# Patient Record
Sex: Female | Born: 1980 | Race: White | Hispanic: No | Marital: Married | Smoking: Never smoker
Health system: Southern US, Community
[De-identification: ages and names within clinical notes are randomized; demographics above are authoritative.]

## PROBLEM LIST (undated history)

## (undated) DIAGNOSIS — E079 Disorder of thyroid, unspecified: Secondary | ICD-10-CM

## (undated) HISTORY — DX: Disorder of thyroid, unspecified: E07.9

---

## 2007-12-22 ENCOUNTER — Encounter: Admission: RE | Admit: 2007-12-22 | Discharge: 2007-12-22 | Payer: Self-pay | Admitting: Family Medicine

## 2013-10-05 ENCOUNTER — Telehealth: Payer: Self-pay | Admitting: Endocrinology

## 2013-10-07 ENCOUNTER — Telehealth: Payer: Self-pay | Admitting: Endocrinology

## 2013-10-07 NOTE — Telephone Encounter (Signed)
Left message to return call 

## 2013-10-08 ENCOUNTER — Telehealth: Payer: Self-pay | Admitting: *Deleted

## 2013-10-08 MED ORDER — LEVOTHYROXINE SODIUM 50 MCG PO TABS: 50.0000 ug | ORAL_TABLET | Freq: Every day | ORAL | Status: DC

## 2013-10-08 NOTE — Telephone Encounter (Signed)
Gave patient 30 days until she makes appointment.

## 2013-11-09 ENCOUNTER — Other Ambulatory Visit: Payer: Self-pay | Admitting: Endocrinology

## 2013-12-13 ENCOUNTER — Other Ambulatory Visit: Payer: Self-pay | Admitting: Endocrinology

## 2014-05-25 ENCOUNTER — Telehealth: Payer: Self-pay | Admitting: Endocrinology

## 2014-05-25 ENCOUNTER — Other Ambulatory Visit: Payer: Self-pay | Admitting: *Deleted

## 2014-05-25 MED ORDER — LEVOTHYROXINE SODIUM 50 MCG PO TABS: 50.0000 ug | ORAL_TABLET | Freq: Every day | ORAL | Status: DC

## 2014-05-25 NOTE — Telephone Encounter (Signed)
Patient would like a refill on her Levothyroxine She has an appt scheduled with Dr. Lucianne Muss on 06/22/14  Thank You :)

## 2014-06-07 ENCOUNTER — Other Ambulatory Visit (HOSPITAL_COMMUNITY)
Admission: RE | Admit: 2014-06-07 | Discharge: 2014-06-07 | Disposition: A | Discharge status: Home / Self Care | Payer: BC Managed Care – PPO | Source: Ambulatory Visit | Attending: Family Medicine | Admitting: Family Medicine

## 2014-06-07 DIAGNOSIS — Z124 Encounter for screening for malignant neoplasm of cervix: Secondary | ICD-10-CM | POA: Insufficient documentation

## 2014-06-20 ENCOUNTER — Other Ambulatory Visit: Payer: Self-pay | Admitting: *Deleted

## 2014-06-20 ENCOUNTER — Other Ambulatory Visit (INDEPENDENT_AMBULATORY_CARE_PROVIDER_SITE_OTHER): Payer: BC Managed Care – PPO

## 2014-06-20 DIAGNOSIS — E039 Hypothyroidism, unspecified: Secondary | ICD-10-CM

## 2014-06-20 LAB — TSH: TSH: 1.56 u[IU]/mL (ref 0.35–4.50)

## 2014-06-20 LAB — T4, FREE: Free T4: 0.96 ng/dL (ref 0.60–1.60)

## 2014-06-22 ENCOUNTER — Encounter: Payer: Self-pay | Admitting: Endocrinology

## 2014-06-22 ENCOUNTER — Ambulatory Visit (INDEPENDENT_AMBULATORY_CARE_PROVIDER_SITE_OTHER): Payer: BC Managed Care – PPO | Admitting: Endocrinology

## 2014-06-22 VITALS — BP 108/68 | HR 84 | Temp 97.7°F | Resp 12 | Ht 63.25 in | Wt 105.6 lb

## 2014-06-22 DIAGNOSIS — E039 Hypothyroidism, unspecified: Secondary | ICD-10-CM

## 2014-06-22 MED ORDER — LEVOTHYROXINE SODIUM 50 MCG PO TABS: 50.0000 ug | ORAL_TABLET | Freq: Every day | ORAL | Status: DC

## 2014-06-22 NOTE — Progress Notes (Signed)
Patient ID: Dawn Tran, female   DOB: Apr 22, 1981, 33 y.o.   MRN: [id number]   Reason for Appointment:  Hypothyroidism, followup visit    History of Present Illness:   She has had hyperthyroidism presenting with Graves' disease in 2009 and this was treated with Tapazole for about 6 months and subsequently had been euthyroid Her about 2 years ago she started getting mildly hypothyroid with symptoms of fatigue; details of this are not available and not clear what her baseline TSH was However with low-dose thyroid supplementation she has felt back to normal and is now back for annual followup   Patient today has no complaints of unusual fatigue, cold sensitivity, dry skin, unusual weight gain or hair loss.            The patient is taking the thyroid supplement very regularly in the morning before breakfast.  Not taking any calcium or iron supplements with the thyroid supplement.    On the last visit the dose was continued unchanged  Appointment on 06/20/2014  Component Date Value Ref Range Status  . TSH 06/20/2014 1.56  0.35 - 4.50 uIU/mL Final  . Free T4 06/20/2014 0.96  0.60 - 1.60 ng/dL Final      Medication List       This list is accurate as of: 06/22/14  9:09 AM.  Always use your most recent med list.               levothyroxine 50 MCG tablet  Commonly known as:  SYNTHROID, LEVOTHROID  Take 1 tablet (50 mcg total) by mouth daily before breakfast.        Allergies: No Known Allergies  No past medical history on file.  No past surgical history on file.  No family history on file.  Social History:  reports that she has never smoked. She has never used smokeless tobacco. Her alcohol and drug histories are not on file.  REVIEW Of SYSTEMS:  Currently not on any vitamins or supplements   Examination:   BP 108/68  Pulse 84  Temp(Src) 97.7 F (36.5 C)  Resp 12  Ht 5' 3.25" (1.607 m)  Wt 105 lb 9.6 oz (47.9 kg)  BMI 18.55 kg/m2  SpO2 99%  GENERAL  APPEARANCE: Alert, no puffiness of the face or eyes  NECK: Thyroid is soft and just palpable on the right side, smooth         NEUROLOGIC EXAM:  biceps reflexes show normal relaxation Skin: Not unusual dry    Assessment   Hypothyroidism, autoimmune with mild stable disease in requiring only 50 mcg of supplement Clinically she is doing well and has minimal goiter.  TSH is again normal   Treatment:   Continue same dosage before breakfast daily. Avoid taking any calcium or iron supplements with the thyroid supplement.    Arleatha Philipps 06/22/2014, 9:09 AM

## 2014-06-22 NOTE — Patient Instructions (Signed)
No change 

## 2015-01-31 NOTE — Telephone Encounter (Signed)
error 

## 2015-06-20 ENCOUNTER — Other Ambulatory Visit: Payer: BC Managed Care – PPO

## 2015-06-23 ENCOUNTER — Encounter: Payer: Self-pay | Admitting: Endocrinology

## 2015-06-23 ENCOUNTER — Other Ambulatory Visit: Payer: Self-pay | Admitting: *Deleted

## 2015-06-23 ENCOUNTER — Ambulatory Visit (INDEPENDENT_AMBULATORY_CARE_PROVIDER_SITE_OTHER): Payer: 59 | Admitting: Endocrinology

## 2015-06-23 VITALS — BP 110/68 | HR 82 | Temp 97.6°F | Resp 14 | Ht 63.25 in | Wt 106.4 lb

## 2015-06-23 DIAGNOSIS — E063 Autoimmune thyroiditis: Secondary | ICD-10-CM

## 2015-06-23 DIAGNOSIS — E038 Other specified hypothyroidism: Secondary | ICD-10-CM | POA: Diagnosis not present

## 2015-06-23 MED ORDER — LEVOTHYROXINE SODIUM 125 MCG PO TABS: ORAL_TABLET | ORAL | Status: DC

## 2015-06-23 NOTE — Patient Instructions (Signed)
Take extra 1 pill weekly then 1/2 of 125ug

## 2015-06-23 NOTE — Progress Notes (Signed)
Patient ID: Dawn Tran, female   DOB: 02-08-1981, 34 y.o.   MRN: [id number]   Reason for Appointment:  Hypothyroidism, followup visit    History of Present Illness:   She has had hyperthyroidism presenting with Graves' disease in 2009 and this was treated with Tapazole for about 6 months and subsequently had been euthyroid In 2013 she started getting mildly hypothyroid with symptoms of fatigue; details of this are not available and not clear what her baseline TSH was However with low-dose thyroid supplementation she felt back to normal  She has been continued on 50 g of levothyroxine  On the last visit a year ago the dose was continued unchanged  Patient today has no complaints of unusual fatigue, change in cold sensitivity, dry skin, unusual weight gain or hair loss.            The patient is taking the thyroid supplement very regularly in the morning before breakfast.  Not taking any calcium or iron supplements with the thyroid supplement.    Labs done by PCP show TSH of 7.7  No visits with results within 1 Week(s) from this visit. Latest known visit with results is:  Appointment on 06/20/2014  Component Date Value Ref Range Status  . TSH 06/20/2014 1.56  0.35 - 4.50 uIU/mL Final  . Free T4 06/20/2014 0.96  0.60 - 1.60 ng/dL Final      Medication List       This list is accurate as of: 06/23/15  9:05 AM.  Always use your most recent med list.               levothyroxine 125 MCG tablet  Commonly known as:  SYNTHROID  Take 1/2 tablet daily        Allergies: No Known Allergies  No past medical history on file.  No past surgical history on file.  Family History  Problem Relation Age of Onset  . Thyroid disease Mother   . Diabetes Neg Hx     Social History:  reports that she has never smoked. She has never used smokeless tobacco. Her alcohol and drug histories are not on file.  REVIEW Of SYSTEMS:  Wt Readings from Last 3 Encounters:  06/23/15 106 lb 6.4 oz  (48.263 kg)  06/22/14 105 lb 9.6 oz (47.9 kg)   Currently not on any vitamins or supplements   Examination:   BP 110/68 mmHg  Pulse 82  Temp(Src) 97.6 F (36.4 C)  Resp 14  Ht 5' 3.25" (1.607 m)  Wt 106 lb 6.4 oz (48.263 kg)  BMI 18.69 kg/m2  SpO2 98%  She looks well, no puffiness of the face or eyes  NECK: Thyroid is soft and just palpable on the right side, smooth         NEUROLOGIC EXAM:  biceps reflexes show normal relaxation Skin: Appears normal, no peripheral edema    Assessment   Hypothyroidism, autoimmune and relatively mild However now her TSH appears to be relatively higher compared to a year ago Surprisingly she is asymptomatic Has only minimal thyroid enlargement as before   Treatment:   She will go up to 62.5 g of Synthroid Needs follow-up in 2 months Discussed potentially needing higher doses in the future also  Danetta Prom 06/23/2015, 9:05 AM

## 2015-08-23 ENCOUNTER — Other Ambulatory Visit (INDEPENDENT_AMBULATORY_CARE_PROVIDER_SITE_OTHER): Payer: 59

## 2015-08-23 ENCOUNTER — Other Ambulatory Visit: Payer: 59

## 2015-08-23 DIAGNOSIS — E038 Other specified hypothyroidism: Secondary | ICD-10-CM | POA: Diagnosis not present

## 2015-08-23 DIAGNOSIS — E063 Autoimmune thyroiditis: Secondary | ICD-10-CM

## 2015-08-23 LAB — TSH: TSH: 6.92 u[IU]/mL — ABNORMAL HIGH (ref 0.35–4.50)

## 2015-08-23 LAB — T4, FREE: FREE T4: 1.02 ng/dL (ref 0.60–1.60)

## 2015-08-28 ENCOUNTER — Encounter: Payer: Self-pay | Admitting: Endocrinology

## 2015-08-28 ENCOUNTER — Ambulatory Visit (INDEPENDENT_AMBULATORY_CARE_PROVIDER_SITE_OTHER): Payer: 59 | Admitting: Endocrinology

## 2015-08-28 VITALS — BP 102/60 | HR 75 | Temp 97.7°F | Resp 14 | Ht 63.25 in | Wt 107.4 lb

## 2015-08-28 DIAGNOSIS — E038 Other specified hypothyroidism: Secondary | ICD-10-CM

## 2015-08-28 DIAGNOSIS — E063 Autoimmune thyroiditis: Secondary | ICD-10-CM

## 2015-08-28 MED ORDER — LEVOTHYROXINE SODIUM 75 MCG PO TABS: 75.0000 ug | ORAL_TABLET | Freq: Every day | ORAL | Status: DC

## 2015-08-28 NOTE — Progress Notes (Signed)
Patient ID: Dawn Tran, female   DOB: Aug 08, 1981, 34 y.o.   MRN: [id number]   Reason for Appointment:  Hypothyroidism, followup visit    History of Present Illness:   She has had hyperthyroidism presenting with Graves' disease in 2009 and this was treated with Tapazole for about 6 months and subsequently had been euthyroid In 2013 she started getting mildly hypothyroid with symptoms of fatigue; details of this are not available and not clear what her baseline TSH was However with low-dose thyroid supplementation she felt back to normal  She previously has been continued on 50 g of levothyroxine   On the last visit in 7/16 since her TSH was 7.7 from her PCP her dose was increased to 62.5 g  Patient again has no complaints of unusual fatigue, change in cold sensitivity, dry skin, unusual weight gain or hair loss.            The patient is taking the thyroid supplement very regularly in the morning before breakfast.   Not taking any calcium or iron supplements with the thyroid supplement.     Appointment on 08/23/2015  Component Date Value Ref Range Status  . TSH 08/23/2015 6.92* 0.35 - 4.50 uIU/mL Final  . Free T4 08/23/2015 1.02  0.60 - 1.60 ng/dL Final      Medication List       This list is accurate as of: 08/28/15  8:53 AM.  Always use your most recent med list.               levothyroxine 125 MCG tablet  Commonly known as:  SYNTHROID  Take 1/2 tablet daily        Allergies: No Known Allergies  No past medical history on file.  No past surgical history on file.  Family History  Problem Relation Age of Onset  . Thyroid disease Mother   . Diabetes Neg Hx     Social History:  reports that she has never smoked. She has never used smokeless tobacco. Her alcohol and drug histories are not on file.  REVIEW Of SYSTEMS:  No recent weight change  Wt Readings from Last 3 Encounters:  08/28/15 107 lb 6.4 oz (48.716 kg)  06/23/15 106 lb 6.4 oz (48.263 kg)   06/22/14 105 lb 9.6 oz (47.9 kg)   Currently not on any vitamins or supplements   Examination:   BP 102/60 mmHg  Pulse 75  Temp(Src) 97.7 F (36.5 C)  Resp 14  Ht 5' 3.25" (1.607 m)  Wt 107 lb 6.4 oz (48.716 kg)  BMI 18.86 kg/m2  SpO2 99%  She looks well, no puffiness of the face   NECK: Thyroid is soft and  palpable 1-1/2 times on the right side, smooth         NEUROLOGIC EXAM:  biceps reflexes show normal relaxation Skin: Appears normal    Assessment   Hypothyroidism, autoimmune and previously relatively mild However now she appears to be needing progressively higher dose for supplementation Again she is not very symptomatic with the change in her TSH level  Has a small thyroid enlargement as before   Treatment:   She will go up to 75 g of Synthroid Needs follow-up in 3 months Again discussed any interaction with OTC supplements  Sarae Nicholes 08/28/2015, 8:53 AM

## 2015-11-23 ENCOUNTER — Other Ambulatory Visit (INDEPENDENT_AMBULATORY_CARE_PROVIDER_SITE_OTHER): Payer: 59

## 2015-11-23 DIAGNOSIS — E038 Other specified hypothyroidism: Secondary | ICD-10-CM | POA: Diagnosis not present

## 2015-11-23 DIAGNOSIS — E063 Autoimmune thyroiditis: Secondary | ICD-10-CM

## 2015-11-23 LAB — TSH: TSH: 2.75 u[IU]/mL (ref 0.35–4.50)

## 2015-11-23 LAB — T4, FREE: Free T4: 1.05 ng/dL (ref 0.60–1.60)

## 2015-11-27 ENCOUNTER — Ambulatory Visit (INDEPENDENT_AMBULATORY_CARE_PROVIDER_SITE_OTHER): Payer: 59 | Admitting: Endocrinology

## 2015-11-27 ENCOUNTER — Encounter: Payer: Self-pay | Admitting: Endocrinology

## 2015-11-27 VITALS — BP 104/62 | HR 69 | Temp 98.7°F | Resp 14 | Ht 63.25 in | Wt 106.4 lb

## 2015-11-27 DIAGNOSIS — E038 Other specified hypothyroidism: Secondary | ICD-10-CM

## 2015-11-27 DIAGNOSIS — Z23 Encounter for immunization: Secondary | ICD-10-CM | POA: Diagnosis not present

## 2015-11-27 DIAGNOSIS — E063 Autoimmune thyroiditis: Secondary | ICD-10-CM

## 2015-11-27 NOTE — Progress Notes (Signed)
     Patient ID: Dawn Tran, female   DOB: [birth year], 34 y.o.   MRN: [id number]   Reason for Appointment:  Hypothyroidism, followup visit    History of Present Illness:   She has had hyperthyroidism presenting with Graves' disease in 2009 and this was treated with Tapazole for about 6 months and subsequently had been euthyroid In 2013 she started getting mildly hypothyroid with symptoms of fatigue; details of this are not available and not clear what her baseline TSH was. However with low-dose thyroid supplementation she felt back to normal    On the visit in 7/16 since her TSH was 7.7 from her PCP her dose was increased to 62.5 g  This was further increased to 75 g in 9/16 since TSH was still about 7     On follow-up she had no complaints of unusual fatigue, change in cold sensitivity, dry skin, unusual weight gain or hair loss.   However she now thinks that after her dose change on her last visit she did feel less fatigued overall  She feels fairly good now             The patient is taking the thyroid supplement very regularly in the morning before breakfast.   Not taking any calcium or iron supplements with the thyroid supplement.     Lab on 11/23/2015  Component Date Value Ref Range Status  . TSH 11/23/2015 2.75  0.35 - 4.50 uIU/mL Final  . Free T4 11/23/2015 1.05  0.60 - 1.60 ng/dL Final      Medication List       This list is accurate as of: 11/27/15  4:13 PM.  Always use your most recent med list.               levothyroxine 75 MCG tablet  Commonly known as:  SYNTHROID, LEVOTHROID  Take 1 tablet (75 mcg total) by mouth daily.        Allergies: No Known Allergies  No past medical history on file.  No past surgical history on file.  Family History  Problem Relation Age of Onset  . Thyroid disease Mother   . Diabetes Neg Hx     Social History:  reports that she has never smoked. She has never used smokeless tobacco. Her alcohol and drug  histories are not on file.  REVIEW Of SYSTEMS:  No recent weight change  Wt Readings from Last 3 Encounters:  11/27/15 106 lb 6.4 oz (48.263 kg)  08/28/15 107 lb 6.4 oz (48.716 kg)  06/23/15 106 lb 6.4 oz (48.263 kg)   Currently not on any vitamins or supplements   Examination:   BP 104/62 mmHg  Pulse 69  Temp(Src) 98.7 F (37.1 C)  Resp 14  Ht 5' 3.25" (1.607 m)  Wt 106 lb 6.4 oz (48.263 kg)  BMI 18.69 kg/m2  SpO2 98%  She looks well Thyroid is soft and  just palpable on the right side     NEUROLOGIC EXAM:  biceps reflexes show normal relaxation Skin: Appears normal    Assessment   Hypothyroidism, autoimmune and previously relatively mild.  She has had progression of her hypothyroidism this year and now taking 75 g compared to 50 g last year.  Subjectively doing well   Has a small thyroid enlargement but not as prominent   Treatment:   She will Continue 75 g of levothyroxine and follow-up in 6 months  Dawn Tran 11/27/2015, 4:13 PM

## 2016-05-23 ENCOUNTER — Other Ambulatory Visit (INDEPENDENT_AMBULATORY_CARE_PROVIDER_SITE_OTHER): Payer: BC Managed Care – PPO

## 2016-05-23 DIAGNOSIS — E063 Autoimmune thyroiditis: Secondary | ICD-10-CM

## 2016-05-23 DIAGNOSIS — E038 Other specified hypothyroidism: Secondary | ICD-10-CM | POA: Diagnosis not present

## 2016-05-23 LAB — TSH: TSH: 0.13 u[IU]/mL — AB (ref 0.35–4.50)

## 2016-05-24 ENCOUNTER — Other Ambulatory Visit (INDEPENDENT_AMBULATORY_CARE_PROVIDER_SITE_OTHER): Payer: BC Managed Care – PPO

## 2016-05-24 DIAGNOSIS — E038 Other specified hypothyroidism: Secondary | ICD-10-CM

## 2016-05-24 DIAGNOSIS — E065 Other chronic thyroiditis: Secondary | ICD-10-CM

## 2016-05-24 LAB — T4, FREE: FREE T4: 2.23 ng/dL — AB (ref 0.60–1.60)

## 2016-05-27 ENCOUNTER — Encounter: Payer: Self-pay | Admitting: Endocrinology

## 2016-05-27 ENCOUNTER — Ambulatory Visit (INDEPENDENT_AMBULATORY_CARE_PROVIDER_SITE_OTHER): Payer: BC Managed Care – PPO | Admitting: Endocrinology

## 2016-05-27 VITALS — BP 106/60 | HR 90 | Ht 63.0 in | Wt 102.0 lb

## 2016-05-27 DIAGNOSIS — E038 Other specified hypothyroidism: Secondary | ICD-10-CM

## 2016-05-27 DIAGNOSIS — E063 Autoimmune thyroiditis: Secondary | ICD-10-CM

## 2016-05-27 NOTE — Progress Notes (Signed)
Patient ID: Dawn Tran, female   DOB: 03/13/1981, 35 y.o.   MRN: [id number]   Reason for Appointment:  Hypothyroidism, followup visit    History of Present Illness:   She has had hyperthyroidism presenting with Graves' disease in 2009 and this was treated with Tapazole for about 6 months and subsequently had been euthyroid In 2013 she started getting mildly hypothyroid with symptoms of fatigue; details of this are not available and not clear what her baseline TSH was. However with low-dose thyroid supplementation she felt back to normal    On the visit in 7/16 since her TSH was 7.7 from her PCP her dose was increased to 62.5 g  This was further increased to 75 g in 9/16 since TSH was still about 7  At that time she had no complaints of unusual fatigue, change in cold sensitivity, dry skin, unusual weight gain or hair loss.   However on follow-up in 12/16 she had increased energy level, TSH was 2.75   She feels fairly good now but has lost weight without change and exercise level or diet Does not feel any palpitations, shakiness, heat intolerance or sweating             The patient is taking the thyroid supplement very regularly in the morning before breakfast.   Not taking any calcium or iron supplements with the thyroid supplement.     Lab on 05/24/2016  Component Date Value Ref Range Status  . Free T4 05/24/2016 2.23* 0.60 - 1.60 ng/dL Final  Lab on 32/44/0102  Component Date Value Ref Range Status  . TSH 05/23/2016 0.13* 0.35 - 4.50 uIU/mL Final   Lab Results  Component Value Date   TSH 0.13* 05/23/2016   TSH 2.75 11/23/2015   TSH 6.92* 08/23/2015   FREET4 2.23* 05/24/2016   FREET4 1.05 11/23/2015   FREET4 1.02 08/23/2015      Medication List       This list is accurate as of: 05/27/16  9:06 AM.  Always use your most recent med list.               levothyroxine 75 MCG tablet  Commonly known as:  SYNTHROID, LEVOTHROID  Take 1 tablet (75 mcg  total) by mouth daily.        Allergies: No Known Allergies  No past medical history on file.  No past surgical history on file.  Family History  Problem Relation Age of Onset  . Thyroid disease Mother   . Diabetes Neg Hx     Social History:  reports that she has never smoked. She has never used smokeless tobacco. Her alcohol and drug histories are not on file.  REVIEW Of SYSTEMS:  No recent weight change  Wt Readings from Last 3 Encounters:  05/27/16 102 lb (46.267 kg)  11/27/15 106 lb 6.4 oz (48.263 kg)  08/28/15 107 lb 6.4 oz (48.716 kg)   Currently not on any vitamins or supplements   Examination:   BP 106/60 mmHg  Pulse 90  Ht  (1.6 m)  Wt 102 lb (46.267 kg)  BMI 18.07 kg/m2  SpO2 98%  She looks well Thyroid is soft and  palpable on the right side, About 1-1/2 times normal      NEUROLOGIC EXAM:  biceps reflexes show slightly brisk relaxation.  No tremor Skin on his hands is not unusually warm No lid lag    Assessment   Hypothyroidism, autoimmune and previously relatively mild.  She had progression of her hypothyroidism in 2016 However now with the same dose of 75 g she has  subclinical hyperthyroidism Subjectively doing well but has lost weight  Has a small thyroid enlargement but not changed  She probably has a slight resurgence of her Graves' disease but not clear if she is simply  needing a reduction in her dosage   Treatment:  She will take a half tablet of the 75 g of levothyroxine and follow-up in one month She will call if she has any change in symptoms Will consider checking receptor antibody on the next visit if needed  [facility] CenterKUMAR,Natalia Wittmeyer 05/27/2016, 9:06 AM

## 2016-05-27 NOTE — Patient Instructions (Signed)
1/2 tab daily 

## 2016-06-27 ENCOUNTER — Other Ambulatory Visit (INDEPENDENT_AMBULATORY_CARE_PROVIDER_SITE_OTHER): Payer: BC Managed Care – PPO

## 2016-06-27 DIAGNOSIS — E063 Autoimmune thyroiditis: Secondary | ICD-10-CM

## 2016-06-27 DIAGNOSIS — E038 Other specified hypothyroidism: Secondary | ICD-10-CM | POA: Diagnosis not present

## 2016-06-27 LAB — T4, FREE: FREE T4: 0.62 ng/dL (ref 0.60–1.60)

## 2016-06-27 LAB — TSH: TSH: 17.18 u[IU]/mL — ABNORMAL HIGH (ref 0.35–4.50)

## 2016-07-02 ENCOUNTER — Ambulatory Visit (INDEPENDENT_AMBULATORY_CARE_PROVIDER_SITE_OTHER): Payer: BC Managed Care – PPO | Admitting: Endocrinology

## 2016-07-02 VITALS — BP 108/62 | HR 73 | Wt 105.0 lb

## 2016-07-02 DIAGNOSIS — E038 Other specified hypothyroidism: Secondary | ICD-10-CM | POA: Diagnosis not present

## 2016-07-02 DIAGNOSIS — E063 Autoimmune thyroiditis: Secondary | ICD-10-CM

## 2016-07-02 NOTE — Patient Instructions (Signed)
Full tab again

## 2016-07-02 NOTE — Progress Notes (Signed)
Patient ID: Dawn Tran, female   DOB: Oct 09, 1981, 35 y.o.   MRN: [id number]   Reason for Appointment:  Hypothyroidism, followup visit    History of Present Illness:   She has had hyperthyroidism presenting with Graves' disease in 2009 and this was treated with Tapazole for about 6 months and subsequently had been euthyroid In 2013 she started getting mildly hypothyroid with symptoms of fatigue; details of this are not available and not clear what her baseline TSH was. However with low-dose thyroid supplementation she felt back to normal    On the visit in 7/16 since her TSH was 7.7 from her PCP her dose was increased to 62.5 g  This was further increased to 75 g in 9/16 since TSH was still about 7  At that time she had no complaints of unusual fatigue, change in cold sensitivity, dry skin, unusual weight gain or hair loss.   However on follow-up in 12/16 she had increased energy level, TSH was 2.75  Recent history:  In the 6/17 visit she had lost some weight she did not have any shakiness of palpitations, her labs reflected hyperthyroidism She was told to cut her medication in half  After her visit she did have occasional periods of jitteriness and shakiness but no other symptoms, this lasted couple of weeks. Currently she is feeling fairly good with her energy level and her weight has come back             The patient is taking the thyroid supplement very regularly in the morning before breakfast.   Not taking any calcium or iron supplements with the thyroid supplement.     Lab on 06/27/2016  Component Date Value Ref Range Status  . Free T4 06/27/2016 0.62  0.60 - 1.60 ng/dL Final  . TSH 78/29/5621 17.18* 0.35 - 4.50 uIU/mL Final   Lab Results  Component Value Date   TSH 17.18* 06/27/2016   TSH 0.13* 05/23/2016   TSH 2.75 11/23/2015   FREET4 0.62 06/27/2016   FREET4 2.23* 05/24/2016   FREET4 1.05 11/23/2015      Medication List       This list is  accurate as of: 07/02/16 10:27 AM.  Always use your most recent med list.               levothyroxine 75 MCG tablet  Commonly known as:  SYNTHROID, LEVOTHROID  Take 1 tablet (75 mcg total) by mouth daily.        Allergies: No Known Allergies  No past medical history on file.  No past surgical history on file.  Family History  Problem Relation Age of Onset  . Thyroid disease Mother   . Diabetes Neg Hx     Social History:  reports that she has never smoked. She has never used smokeless tobacco. Her alcohol and drug histories are not on file.  REVIEW Of SYSTEMS:    Wt Readings from Last 3 Encounters:  07/02/16 105 lb (47.628 kg)  05/27/16 102 lb (46.267 kg)  11/27/15 106 lb 6.4 oz (48.263 kg)   Currently not on any vitamins or supplements   Examination:   BP 108/62 mmHg  Pulse 73  Wt 105 lb (47.628 kg)  SpO2 98%  LMP 06/02/2016  She looks well Thyroid is soft and Slightly enlarged on the right side, About 1-1/2 times normal      NEUROLOGIC EXAM:  biceps reflexes show normal relaxation Skin normal  Assessment   Hypothyroidism, autoimmune and previously relatively mild.  She had progression of her hypothyroidism in 2016  However appears to have had silent thyroiditis last month with marked increase in her thyroid level and now she has become hypothyroid with TSH 17  Has a small thyroid enlargement but not changed  Currently still not having any symptoms of hypothyroidism   Treatment:  She will take a full tablet of the 75 g of levothyroxine and follow-up in 6 weeks again She will call if she has any change in symptoms  Dale Strausser 07/02/2016, 10:27 AM

## 2016-08-08 ENCOUNTER — Other Ambulatory Visit: Payer: BC Managed Care – PPO

## 2016-08-13 ENCOUNTER — Ambulatory Visit: Payer: BC Managed Care – PPO | Admitting: Endocrinology

## 2016-08-15 ENCOUNTER — Other Ambulatory Visit (INDEPENDENT_AMBULATORY_CARE_PROVIDER_SITE_OTHER): Payer: BC Managed Care – PPO

## 2016-08-15 DIAGNOSIS — E063 Autoimmune thyroiditis: Secondary | ICD-10-CM

## 2016-08-15 DIAGNOSIS — E038 Other specified hypothyroidism: Secondary | ICD-10-CM | POA: Diagnosis not present

## 2016-08-15 LAB — T4, FREE: Free T4: 1 ng/dL (ref 0.60–1.60)

## 2016-08-15 LAB — TSH: TSH: 3.86 u[IU]/mL (ref 0.35–4.50)

## 2016-08-20 ENCOUNTER — Ambulatory Visit (INDEPENDENT_AMBULATORY_CARE_PROVIDER_SITE_OTHER): Payer: BC Managed Care – PPO | Admitting: Endocrinology

## 2016-08-20 ENCOUNTER — Encounter: Payer: Self-pay | Admitting: Endocrinology

## 2016-08-20 VITALS — BP 94/62 | HR 76 | Temp 98.2°F | Resp 12 | Ht 62.0 in | Wt 105.4 lb

## 2016-08-20 DIAGNOSIS — E063 Autoimmune thyroiditis: Secondary | ICD-10-CM

## 2016-08-20 DIAGNOSIS — E038 Other specified hypothyroidism: Secondary | ICD-10-CM | POA: Diagnosis not present

## 2016-08-20 NOTE — Progress Notes (Signed)
Patient ID: Dawn Tran, female   DOB: 11-09-1981, 35 y.o.   MRN: [id number]   Reason for Appointment:  Hypothyroidism, followup visit    History of Present Illness:   She has had hyperthyroidism presenting with Graves' disease in 2009 and this was treated with Tapazole for about 6 months and subsequently had been euthyroid In 2013 she started getting mildly hypothyroid with symptoms of fatigue; details of this are not available and not clear what her baseline TSH was. However with low-dose thyroid supplementation she felt back to normal    On the visit in 7/16 since her TSH was 7.7 from her PCP her dose was increased to 62.5 g  This was further increased to 75 g in 9/16 since TSH was still about 7  At that time she had no complaints of unusual fatigue, change in cold sensitivity, dry skin, unusual weight gain or hair loss.   However on follow-up in 12/16 she had increased energy level, TSH was 2.75  Recent history:  In the 6/17 visit she had lost some weight she did not have any shakiness of palpitations, her labs reflected hyperthyroidism She was told to cut her medication in half because her TSH was low and she appeared to have had silent thyroiditis  Her TSH subsequently was high at 17 and she is back on 75 g levothyroxine since 06/2016  Currently she is feeling fairly good with her energy level and her weight has leveled off No change in her chronic cold intolerance             The patient is taking the thyroid supplement very regularly in the morning before breakfast.   Not taking any calcium or iron supplements with the thyroid supplement.     Lab on 08/15/2016  Component Date Value Ref Range Status  . TSH 08/15/2016 3.86  0.35 - 4.50 uIU/mL Final  . Free T4 08/15/2016 1.00  0.60 - 1.60 ng/dL Final   Lab Results  Component Value Date   TSH 3.86 08/15/2016   TSH 17.18 (H) 06/27/2016   TSH 0.13 (L) 05/23/2016   FREET4 1.00 08/15/2016   FREET4 0.62  06/27/2016   FREET4 2.23 (H) 05/24/2016      Medication List       Accurate as of 08/20/16  8:23 AM. Always use your most recent med list.          levothyroxine 75 MCG tablet Commonly known as:  SYNTHROID, LEVOTHROID Take 1 tablet (75 mcg total) by mouth daily.       Allergies: No Known Allergies  No past medical history on file.  No past surgical history on file.  Family History  Problem Relation Age of Onset  . Thyroid disease Mother   . Diabetes Neg Hx     Social History:  reports that she has never smoked. She has never used smokeless tobacco. Her alcohol and drug histories are not on file.  REVIEW Of SYSTEMS:    Wt Readings from Last 3 Encounters:  08/20/16 105 lb 6.4 oz (47.8 kg)  07/02/16 105 lb (47.6 kg)  05/27/16 102 lb (46.3 kg)   Currently not on any vitamins or supplements   Examination:   BP 94/62   Pulse 76   Temp 98.2 F (36.8 C)   Resp 12   Ht 5\' 2"  (1.575 m)   Wt 105 lb 6.4 oz (47.8 kg)   SpO2 98%   BMI 19.28 kg/m   She  looks well Thyroid is soft and Mildly enlarged on the right side, About 1-1/2 times normal      NEUROLOGIC EXAM:  biceps reflexes show normal relaxation Skin normal     Assessment   Hypothyroidism, autoimmune and previously relatively mild.  She had progression of her hypothyroidism in 2016  After an episode of probable silent thyroiditis her thyroxine requirement is back to 75 g again She is feeling subjectively well  Has a small thyroid enlargement but not changed  Currently still not having any symptoms of hypothyroidism even though her TSH is high normal at 3.8   Treatment:  She will continue to take a full tablet of the 75 g of levothyroxine and follow-up in 4 months She will call if she has any change in symptoms  Katalena Malveaux 08/20/2016, 8:23 AM

## 2016-09-05 ENCOUNTER — Other Ambulatory Visit: Payer: Self-pay | Admitting: Endocrinology

## 2016-12-06 ENCOUNTER — Other Ambulatory Visit: Payer: Self-pay | Admitting: Endocrinology

## 2016-12-06 NOTE — Telephone Encounter (Signed)
Levothyroxine 75 mcg sent to [phone]

## 2016-12-17 ENCOUNTER — Other Ambulatory Visit (INDEPENDENT_AMBULATORY_CARE_PROVIDER_SITE_OTHER): Payer: BC Managed Care – PPO

## 2016-12-17 DIAGNOSIS — E038 Other specified hypothyroidism: Secondary | ICD-10-CM | POA: Diagnosis not present

## 2016-12-17 DIAGNOSIS — E063 Autoimmune thyroiditis: Secondary | ICD-10-CM

## 2016-12-17 LAB — TSH: TSH: 1.78 u[IU]/mL (ref 0.35–4.50)

## 2016-12-17 LAB — T4, FREE: Free T4: 1.02 ng/dL (ref 0.60–1.60)

## 2016-12-20 ENCOUNTER — Encounter: Payer: Self-pay | Admitting: Endocrinology

## 2016-12-20 ENCOUNTER — Ambulatory Visit (INDEPENDENT_AMBULATORY_CARE_PROVIDER_SITE_OTHER): Payer: BC Managed Care – PPO | Admitting: Endocrinology

## 2016-12-20 VITALS — BP 106/60 | HR 76 | Ht 63.78 in | Wt 106.2 lb

## 2016-12-20 DIAGNOSIS — E063 Autoimmune thyroiditis: Secondary | ICD-10-CM

## 2016-12-20 DIAGNOSIS — E038 Other specified hypothyroidism: Secondary | ICD-10-CM

## 2016-12-20 DIAGNOSIS — Z23 Encounter for immunization: Secondary | ICD-10-CM | POA: Diagnosis not present

## 2016-12-20 NOTE — Progress Notes (Signed)
Patient ID: Dawn Tran, female   DOB: [birth year], 36 y.o.   MRN: [id number]   Reason for Appointment:  Hypothyroidism, followup visit    History of Present Illness:   She has had hyperthyroidism presenting with Graves' disease in 2009 and this was treated with Tapazole for about 6 months and subsequently had been euthyroid In 2013 she started getting mildly hypothyroid with symptoms of fatigue; details of this are not available and not clear what her baseline TSH was. However with low-dose thyroid supplementation she felt back to normal    On the visit in 7/16 since her TSH was 7.7 from her PCP her dose was increased to 62.5 g  This was further increased to 75 g in 9/16 since TSH was still about 7  At that time she had no complaints of unusual fatigue, change in cold sensitivity, dry skin, unusual weight gain or hair loss but did feel better after the dosage increase.   Recent history: In  6/17  she had lost some weight she did not have any shakiness of palpitations, her labs reflected hyperthyroidism She was told to cut her medication in half because her TSH was low and she appeared to have had silent thyroiditis Her TSH subsequently was high at 17 and she is back on 75 g levothyroxine since 06/2016  TSH was back to normal in August and she is now here for 4 month follow-up  Currently she is feeling fairly good with her energy level  Weight is stable No change in her chronic cold intolerance             The patient is taking the thyroid supplement very regularly in the morning before breakfast.   Not taking any calcium or iron supplements with the thyroid supplement.     Lab Results  Component Value Date   TSH 1.78 12/17/2016   TSH 3.86 08/15/2016   TSH 17.18 (H) 06/27/2016   FREET4 1.02 12/17/2016   FREET4 1.00 08/15/2016   FREET4 0.62 06/27/2016    Allergies as of 12/20/2016   No Known Allergies     Medication List       Accurate as of 12/20/16  9:11  AM. Always use your most recent med list.          levothyroxine 75 MCG tablet Commonly known as:  SYNTHROID, LEVOTHROID TAKE 1 TABLET(75 MCG) BY MOUTH DAILY       Allergies: No Known Allergies  No past medical history on file.  No past surgical history on file.  Family History  Problem Relation Age of Onset  . Thyroid disease Mother   . Diabetes Neg Hx     Social History:  reports that she has never smoked. She has never used smokeless tobacco. Her alcohol and drug histories are not on file.  REVIEW Of SYSTEMS:    Wt Readings from Last 3 Encounters:  12/20/16 106 lb 3.2 oz (48.2 kg)  08/20/16 105 lb 6.4 oz (47.8 kg)  07/02/16 105 lb (47.6 kg)   Currently not on any vitamins or supplements   Examination:   BP 106/60   Pulse 76   Ht 5' 3.78" (1.62 m)   Wt 106 lb 3.2 oz (48.2 kg)   SpO2 97%   BMI 18.36 kg/m   She looks well Thyroid is Not clearly palpable Skin normal     Assessment   Hypothyroidism, autoimmune and previously relatively mild.  She had progression of her hypothyroidism in  2016  After an episode of probable silent thyroiditis her thyroxine requirement is back to 75 g again for over 6 months now She is feeling well Thyroid enlargement appears to be smaller  TSH is excellent now    Treatment:  She will continue to take a full tablet of the 75 g of levothyroxine and follow-up annually She will call if she has any recurrence of unusual fatigue, weight change or heat or cold intolerance  Influenza vaccine given  Jylian Pappalardo 12/20/2016, 9:11 AM

## 2017-03-07 ENCOUNTER — Other Ambulatory Visit: Payer: Self-pay | Admitting: Endocrinology

## 2017-05-31 ENCOUNTER — Other Ambulatory Visit: Payer: Self-pay | Admitting: Endocrinology

## 2017-09-10 DIAGNOSIS — R002 Palpitations: Secondary | ICD-10-CM | POA: Insufficient documentation

## 2017-09-22 ENCOUNTER — Ambulatory Visit (INDEPENDENT_AMBULATORY_CARE_PROVIDER_SITE_OTHER): Payer: BC Managed Care – PPO

## 2017-09-22 DIAGNOSIS — R002 Palpitations: Secondary | ICD-10-CM

## 2017-12-17 ENCOUNTER — Other Ambulatory Visit (INDEPENDENT_AMBULATORY_CARE_PROVIDER_SITE_OTHER): Payer: BC Managed Care – PPO

## 2017-12-17 DIAGNOSIS — E063 Autoimmune thyroiditis: Secondary | ICD-10-CM | POA: Diagnosis not present

## 2017-12-17 LAB — T4, FREE: FREE T4: 0.91 ng/dL (ref 0.60–1.60)

## 2017-12-17 LAB — TSH: TSH: 1.65 u[IU]/mL (ref 0.35–4.50)

## 2017-12-19 ENCOUNTER — Encounter: Payer: Self-pay | Admitting: Endocrinology

## 2017-12-19 ENCOUNTER — Ambulatory Visit: Payer: BC Managed Care – PPO | Admitting: Endocrinology

## 2017-12-19 VITALS — BP 106/64 | HR 73 | Ht 63.78 in | Wt 106.0 lb

## 2017-12-19 DIAGNOSIS — E063 Autoimmune thyroiditis: Secondary | ICD-10-CM | POA: Diagnosis not present

## 2017-12-19 NOTE — Progress Notes (Signed)
Patient ID: Dawn Tran, female   DOB: 06-24-1981, 37 y.o.   MRN: [id number]   Reason for Appointment:  Hypothyroidism, followup visit    History of Present Illness:   She has had hyperthyroidism presenting with Graves' disease in 2009 and this was treated with Tapazole for about 6 months and subsequently had been euthyroid In 2013 she started getting mildly hypothyroid with symptoms of fatigue; details of this are not available and not clear what her baseline TSH was. However with low-dose thyroid supplementation she felt back to normal    On the visit in 7/16 since her TSH was 7.7 from her PCP her dose was increased to 62.5 g  This was further increased to 75 g in 9/16 since TSH was still about 7  At that time she had no complaints of unusual fatigue, change in cold sensitivity, dry skin, unusual weight gain or hair loss but did feel better after the dosage increase.   Recent history: In  05/2016  she had lost weight but she did not have any shakiness of palpitations, her labs reflected hyperthyroidism She was told to cut her medication in half because her TSH was low and she appeared to have had silent thyroiditis Her TSH subsequently was high at 17 and she is back on 75 g levothyroxine since 06/2016  TSH subsequently has been consistently normal and was seeing a year ago Recently she has been feeling fairly good with her energy level  No hair loss Weight is stable No change in her chronic cold intolerance             The patient is taking the thyroid supplement very consistently daily in the morning before breakfast.   Not taking any calcium or iron supplements with the thyroid supplement.    Her TSH level is again quite normal  Lab Results  Component Value Date   TSH 1.65 12/17/2017   TSH 1.78 12/17/2016   TSH 3.86 08/15/2016   FREET4 0.91 12/17/2017   FREET4 1.02 12/17/2016   FREET4 1.00 08/15/2016    Allergies as of 12/19/2017   No Known Allergies     Medication List        Accurate as of 12/19/17 11:59 PM. Always use your most recent med list.          levothyroxine 75 MCG tablet Commonly known as:  SYNTHROID, LEVOTHROID TAKE 1 TABLET(75 MCG) BY MOUTH DAILY       Allergies: No Known Allergies  History reviewed. No pertinent past medical history.  History reviewed. No pertinent surgical history.  Family History  Problem Relation Age of Onset  . Thyroid disease Mother   . Diabetes Neg Hx   . Cancer Neg Hx     Social History:  reports that  has never smoked. she has never used smokeless tobacco. Her alcohol and drug histories are not on file.  REVIEW Of SYSTEMS:    Wt Readings from Last 3 Encounters:  12/19/17 106 lb (48.1 kg)  12/20/16 106 lb 3.2 oz (48.2 kg)  08/20/16 105 lb 6.4 oz (47.8 kg)   Currently not on any vitamins or supplements   Examination:   BP 106/64   Pulse 73   Ht 5' 3.78" (1.62 m)   Wt 106 lb (48.1 kg)   SpO2 97%   BMI 18.32 kg/m   She looks well Thyroid is barely palpable on the right, soft and smooth Biceps reflexes normal Skin normal  Assessment   Hypothyroidism, autoimmune and previously relatively mild.  She had progression of her hypothyroidism in 2016  After an episode of probable silent thyroiditis her thyroxine requirement is back to 75 g again This has been fairly consistent She is feeling well Thyroid enlargement is minimal  TSH is almost the same as on her last visit now    Treatment:  She will continue to take the 75 g of levothyroxine daily in the morning and follow-up annually She will call if she has any recurrence of unusual fatigue, weight change or heat or cold intolerance    Dawn Tran 12/20/2017, 4:29 PM

## 2017-12-20 ENCOUNTER — Encounter: Payer: Self-pay | Admitting: Endocrinology

## 2018-02-28 ENCOUNTER — Other Ambulatory Visit: Payer: Self-pay | Admitting: Endocrinology

## 2018-11-26 ENCOUNTER — Other Ambulatory Visit: Payer: Self-pay | Admitting: Endocrinology

## 2018-11-26 ENCOUNTER — Other Ambulatory Visit: Payer: Self-pay

## 2018-11-26 MED ORDER — LEVOTHYROXINE SODIUM 75 MCG PO TABS: ORAL_TABLET | ORAL | 2 refills | Status: DC

## 2018-11-28 ENCOUNTER — Other Ambulatory Visit: Payer: Self-pay | Admitting: Endocrinology

## 2018-11-28 MED ORDER — LEVOTHYROXINE SODIUM 75 MCG PO TABS: ORAL_TABLET | ORAL | 2 refills | Status: DC

## 2018-12-21 ENCOUNTER — Other Ambulatory Visit: Payer: Self-pay | Admitting: Endocrinology

## 2018-12-21 DIAGNOSIS — E063 Autoimmune thyroiditis: Secondary | ICD-10-CM

## 2018-12-23 ENCOUNTER — Other Ambulatory Visit (INDEPENDENT_AMBULATORY_CARE_PROVIDER_SITE_OTHER): Payer: BC Managed Care – PPO

## 2018-12-23 DIAGNOSIS — E063 Autoimmune thyroiditis: Secondary | ICD-10-CM

## 2018-12-23 LAB — T4, FREE: Free T4: 1.04 ng/dL (ref 0.60–1.60)

## 2018-12-23 LAB — TSH: TSH: 3.34 u[IU]/mL (ref 0.35–4.50)

## 2018-12-25 ENCOUNTER — Encounter: Payer: Self-pay | Admitting: Endocrinology

## 2018-12-25 ENCOUNTER — Ambulatory Visit: Payer: BC Managed Care – PPO | Admitting: Endocrinology

## 2018-12-25 VITALS — BP 100/60 | HR 80 | Ht 63.78 in | Wt 113.6 lb

## 2018-12-25 DIAGNOSIS — E063 Autoimmune thyroiditis: Secondary | ICD-10-CM

## 2018-12-25 NOTE — Progress Notes (Signed)
Patient ID: Dawn Tran, female   DOB: 29-Jun-1981, 38 y.o.   MRN: [id number]   Reason for Appointment:  Hypothyroidism, followup visit    History of Present Illness:   She has had hyperthyroidism from Graves' disease in 2009 and this was treated with Tapazole for about 6 months and subsequently had been euthyroid In 2013 she started getting mildly hypothyroid with symptoms of fatigue; details of this are not available and not clear what her baseline TSH was. However with low-dose thyroid supplementation she felt back to normal    On the visit in 7/16 since her TSH was 7.7 from her PCP her dose was increased to 62.5 g  This was further increased to 75 g in 9/16 since TSH was still about 7  At that time she had no complaints of unusual fatigue, change in cold sensitivity, dry skin, unusual weight gain or hair loss but did feel better after the dosage increase.   Recent history: In  05/2016  she had lost weight but she did not have any shakiness of palpitations, her labs reflected hyperthyroidism She was told to cut her medication in half because her TSH was low and she appeared to have had silent thyroiditis Her TSH subsequently was high at 17 and she is back on 75 g levothyroxine since 06/2016  TSH subsequently has been consistently normal with 75 mcg daily She does not know why she has gained weight Does not complain of new cold intolerance, hair loss or unusual fatigue Still taking generic levothyroxine             The patient is taking the thyroid supplement very consistently daily in the morning about 30 minutes before breakfast.   Not taking any calcium or iron supplements with the thyroid supplement.    Her TSH level is slightly higher than before but still normal    Lab Results  Component Value Date   TSH 3.34 12/23/2018   TSH 1.65 12/17/2017   TSH 1.78 12/17/2016   FREET4 1.04 12/23/2018   FREET4 0.91 12/17/2017   FREET4 1.02 12/17/2016    Allergies  as of 12/25/2018   No Known Allergies     Medication List       Accurate as of December 25, 2018 11:59 PM. Always use your most recent med list.        levothyroxine 75 MCG tablet Commonly known as:  SYNTHROID, LEVOTHROID TAKE 1 TABLET(75 MCG) BY MOUTH DAILY       Allergies: No Known Allergies  Past Medical History:  Diagnosis Date  . Thyroid disease    Graves' disease in 2009    History reviewed. No pertinent surgical history.  Family History  Problem Relation Age of Onset  . Thyroid disease Mother   . Diabetes Neg Hx   . Cancer Neg Hx     Social History:  reports that she has never smoked. She has never used smokeless tobacco. No history on file for alcohol and drug.  REVIEW Of SYSTEMS:  Recent weight changes:  Wt Readings from Last 3 Encounters:  12/25/18 113 lb 9.6 oz (51.5 kg)  12/19/17 106 lb (48.1 kg)  12/20/16 106 lb 3.2 oz (48.2 kg)      Examination:   BP 100/60 (BP Location: Left Arm, Patient Position: Sitting, Cuff Size: Normal)   Pulse 80   Ht 5' 3.78" (1.62 m)   Wt 113 lb 9.6 oz (51.5 kg)   SpO2 96%   BMI  19.63 kg/m   She looks well Thyroid is palpable on the right, soft and smooth, about 1-1/2 times normal Biceps reflexes normal Skin appears normal     Assessment   Hypothyroidism, autoimmune and with minimal thyroid enlargement  She has had fairly stable hypothyroidism since about 2017  She is feeling good with her energy level and did not report any changes recently  Thyroid enlargement is similar to previous exams and only slight on the right side  TSH is minimally higher and still in the normal range  She is compliant with her supplement  WEIGHT gain: This is possibly from changes in diet recently and inadequate exercise and unrelated to the thyroid, however her BMI is still below 20   Treatment:  Since she is asymptomatic she will continue to take 75 g of levothyroxine daily However will have her come back in 6 months   To call if she has any new fatigue    Reather Littler 12/26/2018, 5:28 PM

## 2018-12-26 ENCOUNTER — Encounter: Payer: Self-pay | Admitting: Endocrinology

## 2019-06-09 ENCOUNTER — Other Ambulatory Visit: Payer: Self-pay

## 2019-06-28 ENCOUNTER — Other Ambulatory Visit: Payer: BC Managed Care – PPO

## 2019-07-01 ENCOUNTER — Ambulatory Visit: Payer: BC Managed Care – PPO | Admitting: Endocrinology

## 2019-07-12 ENCOUNTER — Other Ambulatory Visit (INDEPENDENT_AMBULATORY_CARE_PROVIDER_SITE_OTHER): Payer: BC Managed Care – PPO

## 2019-07-12 ENCOUNTER — Other Ambulatory Visit: Payer: Self-pay

## 2019-07-12 DIAGNOSIS — E063 Autoimmune thyroiditis: Secondary | ICD-10-CM

## 2019-07-12 LAB — TSH: TSH: 1.91 u[IU]/mL (ref 0.35–4.50)

## 2019-07-12 LAB — T4, FREE: Free T4: 1.15 ng/dL (ref 0.60–1.60)

## 2019-07-15 NOTE — Progress Notes (Signed)
Patient ID: Dawn Tran, female   DOB: 06/25/1981, 38 y.o.   MRN: [id number]  Today's office visit was provided via telemedicine using video technique The patient was explained the limitations of evaluation and management by telemedicine and the availability of in person appointments.  The patient understood the limitations and agreed to proceed. Patient also understood that the telehealth visit is billable. . Location of the patient: Patient's home . Location of the provider: Physician office Only the patient and myself were participating in the encounter    Reason for Appointment:  Hypothyroidism, followup visit    History of Present Illness:   She has had hyperthyroidism from Graves' disease in 2009 and this was treated with Tapazole for about 6 months and subsequently had been euthyroid In 2013 she started getting mildly hypothyroid with symptoms of fatigue; details of this are not available and not clear what her baseline TSH was. However with low-dose thyroid supplementation she felt back to normal    On the visit in 7/16 since her TSH was 7.7 from her PCP her dose was increased to 62.5 g  This was further increased to 75 g in 9/16 since TSH was still about 7  At that time she had no complaints of unusual fatigue, change in cold sensitivity, dry skin, unusual weight gain or hair loss but did feel better after the dosage increase.   Recent history: In  05/2016  she had lost weight but she did not have any shakiness of palpitations, her labs reflected hyperthyroidism She was told to cut her medication in half because her TSH was low and she appeared to have had silent thyroiditis Her TSH subsequently was high at 17 and she is back on 75 g levothyroxine since 06/2016  TSH subsequently has been consistently normal with 75 mcg levothyroxine daily  She has not had a recurrence of her usual hypothyroid symptoms of easy fatigability No recent weight change Occasionally  may feel tired but she thinks this could be from stressful situations Hair loss             The patient is taking the generic thyroid supplement very consistently daily in the morning on waking up at least about 30 minutes before breakfast.   Not taking any calcium or iron supplements with the thyroid supplement.   She takes her calcium in the evening after dinner   Her TSH level which was previously slightly higher than before is back to 1.9  She does have a history of a small goiter on previous exam  Lab Results  Component Value Date   TSH 1.91 07/12/2019   TSH 3.34 12/23/2018   TSH 1.65 12/17/2017   FREET4 1.15 07/12/2019   FREET4 1.04 12/23/2018   FREET4 0.91 12/17/2017    Allergies as of 07/16/2019   No Known Allergies     Medication List       Accurate as of July 15, 2019  9:18 PM. If you have any questions, ask your nurse or doctor.        levothyroxine 75 MCG tablet Commonly known as: SYNTHROID TAKE 1 TABLET(75 MCG) BY MOUTH DAILY       Allergies: No Known Allergies  Past Medical History:  Diagnosis Date  . Thyroid disease    Graves' disease in 2009    No past surgical history on file.  Family History  Problem Relation Age of Onset  . Thyroid disease Mother   . Diabetes Neg Hx   .  Cancer Neg Hx     Social History:  reports that she has never smoked. She has never used smokeless tobacco. No history on file for alcohol and drug.  REVIEW Of SYSTEMS:  She is trying to take calcium and magnesium supplement regularly   Examination:   There were no vitals taken for this visit.  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

## 2019-07-16 ENCOUNTER — Other Ambulatory Visit: Payer: Self-pay

## 2019-07-16 ENCOUNTER — Encounter: Payer: Self-pay | Admitting: Endocrinology

## 2019-07-16 ENCOUNTER — Ambulatory Visit (INDEPENDENT_AMBULATORY_CARE_PROVIDER_SITE_OTHER): Payer: BC Managed Care – PPO | Admitting: Endocrinology

## 2019-07-16 DIAGNOSIS — E063 Autoimmune thyroiditis: Secondary | ICD-10-CM | POA: Diagnosis not present

## 2019-08-22 ENCOUNTER — Other Ambulatory Visit: Payer: Self-pay | Admitting: Endocrinology

## 2020-05-13 ENCOUNTER — Other Ambulatory Visit: Payer: Self-pay | Admitting: Endocrinology

## 2020-07-14 ENCOUNTER — Ambulatory Visit: Payer: BC Managed Care – PPO | Admitting: Endocrinology

## 2020-08-09 ENCOUNTER — Other Ambulatory Visit: Payer: BC Managed Care – PPO

## 2020-08-11 ENCOUNTER — Other Ambulatory Visit: Payer: Self-pay | Admitting: Endocrinology

## 2020-08-15 ENCOUNTER — Other Ambulatory Visit: Payer: Self-pay | Admitting: Endocrinology

## 2020-08-15 DIAGNOSIS — E063 Autoimmune thyroiditis: Secondary | ICD-10-CM

## 2020-08-16 ENCOUNTER — Other Ambulatory Visit (INDEPENDENT_AMBULATORY_CARE_PROVIDER_SITE_OTHER): Payer: BC Managed Care – PPO

## 2020-08-16 ENCOUNTER — Other Ambulatory Visit: Payer: Self-pay

## 2020-08-16 DIAGNOSIS — E063 Autoimmune thyroiditis: Secondary | ICD-10-CM

## 2020-08-16 LAB — TSH: TSH: 2.99 u[IU]/mL (ref 0.35–4.50)

## 2020-08-16 LAB — T4, FREE: Free T4: 1.06 ng/dL (ref 0.60–1.60)

## 2020-08-22 ENCOUNTER — Encounter: Payer: Self-pay | Admitting: Endocrinology

## 2020-08-22 ENCOUNTER — Ambulatory Visit: Payer: BC Managed Care – PPO | Admitting: Endocrinology

## 2020-08-22 ENCOUNTER — Other Ambulatory Visit: Payer: Self-pay

## 2020-08-22 VITALS — BP 100/62 | HR 88 | Ht 63.78 in | Wt 112.6 lb

## 2020-08-22 DIAGNOSIS — E063 Autoimmune thyroiditis: Secondary | ICD-10-CM

## 2020-08-22 NOTE — Progress Notes (Signed)
Patient ID: Dawn Tran, female   DOB: 1981-02-25, 39 y.o.   MRN: [id number]    Reason for Appointment:  Hypothyroidism, followup visit    History of Present Illness:   She has had hyperthyroidism from Graves' disease in 2009 and this was treated with Tapazole for about 6 months and subsequently had been euthyroid In 2013 she started getting mildly hypothyroid with symptoms of fatigue; details of this are not available and not clear what her baseline TSH was. However with low-dose thyroid supplementation she felt back to normal    On the visit in 7/16 since her TSH was 7.7 from her PCP her dose was increased to 62.5 g  This was further increased to 75 g in 9/16 since TSH was still about 7  At that time she had no complaints of unusual fatigue, change in cold sensitivity, dry skin, unusual weight gain or hair loss but did feel better after the dosage increase.  In  05/2016  she had lost weight but she did not have any shakiness of palpitations, her labs reflected hyperthyroidism She was told to cut her medication in half because her TSH was low and she appeared to have had silent thyroiditis Her TSH subsequently was high at 17  Recent history: She has been on 75 g levothyroxine since 06/2016  TSH subsequently has been consistently normal with 75 mcg levothyroxine daily  Previously with hypothyroidism she would have easy fatigability but she feels fairly good now No recent weight change She is very regular with taking her levothyroxine daily in the morning on waking up at least about 30 minutes before breakfast.   Not taking any calcium or iron supplements with the thyroid supplement, not on any supplements currently  Her TSH level is again in the normal range at about 3  Previous exams had shown a small goiter  Wt Readings from Last 3 Encounters:  08/22/20 112 lb 9.6 oz (51.1 kg)  12/25/18 113 lb 9.6 oz (51.5 kg)  12/19/17 106 lb (48.1 kg)     Lab Results    Component Value Date   TSH 2.99 08/16/2020   TSH 1.91 07/12/2019   TSH 3.34 12/23/2018   FREET4 1.06 08/16/2020   FREET4 1.15 07/12/2019   FREET4 1.04 12/23/2018    Allergies as of 08/22/2020   No Known Allergies     Medication List       Accurate as of August 22, 2020  8:56 AM. If you have any questions, ask your nurse or doctor.        levothyroxine 75 MCG tablet Commonly known as: SYNTHROID TAKE 1 TABLET(75 MCG) BY MOUTH DAILY       Allergies: No Known Allergies  Past Medical History:  Diagnosis Date   Thyroid disease    Graves' disease in 2009    No past surgical history on file.  Family History  Problem Relation Age of Onset   Thyroid disease Mother    Diabetes Neg Hx    Cancer Neg Hx     Social History:  reports that she has never smoked. She has never used smokeless tobacco. No history on file for alcohol use and drug use.  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

## 2020-08-23 ENCOUNTER — Other Ambulatory Visit: Payer: Self-pay | Admitting: Endocrinology

## 2021-05-13 ENCOUNTER — Other Ambulatory Visit: Payer: Self-pay | Admitting: Endocrinology

## 2021-08-21 ENCOUNTER — Other Ambulatory Visit: Payer: Self-pay

## 2021-08-21 ENCOUNTER — Other Ambulatory Visit (INDEPENDENT_AMBULATORY_CARE_PROVIDER_SITE_OTHER): Payer: BC Managed Care – PPO

## 2021-08-21 DIAGNOSIS — E063 Autoimmune thyroiditis: Secondary | ICD-10-CM

## 2021-08-21 LAB — TSH: TSH: 2.45 u[IU]/mL (ref 0.35–5.50)

## 2021-08-21 LAB — T4, FREE: Free T4: 1.07 ng/dL (ref 0.60–1.60)

## 2021-08-24 ENCOUNTER — Ambulatory Visit: Payer: BC Managed Care – PPO | Admitting: Endocrinology

## 2021-08-28 NOTE — Progress Notes (Signed)
Patient ID: Dawn Tran, female   DOB: 1981/08/01, 40 y.o.   MRN: [id number]    Reason for Appointment:  Hypothyroidism, followup visit    History of Present Illness:   She has had hyperthyroidism from Graves' disease in 2009 and this was treated with Tapazole for about 6 months and subsequently had been euthyroid In 2013 she started getting mildly hypothyroid with symptoms of fatigue; details of this are not available and not clear what her baseline TSH was. However with low-dose thyroid supplementation she felt back to normal    On the visit in 7/16 since her TSH was 7.7 from her PCP her dose was increased to 62.5 g  This was further increased to 75 g in 9/16 since TSH was still about 7  At that time she had no complaints of unusual fatigue, change in cold sensitivity, dry skin, unusual weight gain or hair loss but did feel better after the dosage increase.  In  05/2016  she had lost weight but she did not have any shakiness of palpitations, her labs reflected hyperthyroidism She was told to cut her medication in half because her TSH was low and she appeared to have had silent thyroiditis Her TSH subsequently was high at 17  Recent history: She has been on 75 g levothyroxine since 06/2016  TSH subsequently has been consistently normal with 75 mcg levothyroxine daily  Previously with hypothyroidism she would have easy fatigability She will periodically feel a little more tired but only for couple of days She may also have some difficulty sleeping Her weight has been about the same now for the last 2 years at least  She is very regular with taking her levothyroxine daily in the morning at least about 30 minutes before breakfast.   Not taking any calcium or iron supplements or multivitamins  Her TSH level is very stable in the normal range   More recently she has not had a palpable goiter not on any  Wt Readings from Last 3 Encounters:  08/29/21 112 lb 12.8 oz  (51.2 kg)  08/22/20 112 lb 9.6 oz (51.1 kg)  12/25/18 113 lb 9.6 oz (51.5 kg)     Lab Results  Component Value Date   TSH 2.45 08/21/2021   TSH 2.99 08/16/2020   TSH 1.91 07/12/2019   FREET4 1.07 08/21/2021   FREET4 1.06 08/16/2020   FREET4 1.15 07/12/2019    Allergies as of 08/29/2021   No Known Allergies      Medication List        Accurate as of August 29, 2021  8:21 AM. If you have any questions, ask your nurse or doctor.          levothyroxine 75 MCG tablet Commonly known as: SYNTHROID TAKE 1 TABLET(75 MCG) BY MOUTH DAILY        Allergies: No Known Allergies  Past Medical History:  Diagnosis Date   Thyroid disease    Graves' disease in 2009    No past surgical history on file.  Family History  Problem Relation Age of Onset   Thyroid disease Mother    Diabetes Neg Hx    Cancer Neg Hx     Social History:  reports that she has never smoked. She has never used smokeless tobacco. No history on file for alcohol use and drug use.  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

## 2021-08-29 ENCOUNTER — Encounter: Payer: Self-pay | Admitting: Endocrinology

## 2021-08-29 ENCOUNTER — Other Ambulatory Visit: Payer: Self-pay

## 2021-08-29 ENCOUNTER — Ambulatory Visit: Payer: BC Managed Care – PPO | Admitting: Endocrinology

## 2021-08-29 VITALS — BP 102/70 | HR 76 | Ht 62.0 in | Wt 112.8 lb

## 2021-08-29 DIAGNOSIS — E063 Autoimmune thyroiditis: Secondary | ICD-10-CM | POA: Diagnosis not present

## 2022-05-06 ENCOUNTER — Other Ambulatory Visit: Payer: Self-pay | Admitting: Endocrinology

## 2022-08-26 ENCOUNTER — Other Ambulatory Visit (INDEPENDENT_AMBULATORY_CARE_PROVIDER_SITE_OTHER): Payer: BC Managed Care – PPO

## 2022-08-26 DIAGNOSIS — E063 Autoimmune thyroiditis: Secondary | ICD-10-CM | POA: Diagnosis not present

## 2022-08-26 LAB — T4, FREE: Free T4: 1.2 ng/dL (ref 0.60–1.60)

## 2022-08-26 LAB — TSH: TSH: 2.61 u[IU]/mL (ref 0.35–5.50)

## 2022-08-29 ENCOUNTER — Encounter: Payer: Self-pay | Admitting: Endocrinology

## 2022-08-29 ENCOUNTER — Ambulatory Visit (INDEPENDENT_AMBULATORY_CARE_PROVIDER_SITE_OTHER): Payer: BC Managed Care – PPO | Admitting: Endocrinology

## 2022-08-29 VITALS — BP 100/74 | HR 97 | Ht 62.0 in | Wt 113.2 lb

## 2022-08-29 DIAGNOSIS — E063 Autoimmune thyroiditis: Secondary | ICD-10-CM | POA: Diagnosis not present

## 2022-08-29 NOTE — Progress Notes (Signed)
Patient ID: Dawn Tran, female   DOB: 09-15-1981, 41 y.o.   MRN: [id number]    Reason for Appointment:  Hypothyroidism, followup visit    History of Present Illness:   She has had hyperthyroidism from Graves' disease in 2009 and this was treated with Tapazole for about 6 months and subsequently had been euthyroid In 2013 she started getting mildly hypothyroid with symptoms of fatigue; details of this are not available and not clear what her baseline TSH was. However with low-dose thyroid supplementation she felt back to normal    On the visit in 7/16 since her TSH was 7.7 from her PCP her dose was increased to 62.5 g  This was further increased to 75 g in 9/16 since TSH was still about 7  At that time she had no complaints of unusual fatigue, change in cold sensitivity, dry skin, unusual weight gain or hair loss but did feel better after the dosage increase.  In  05/2016  she had lost weight but she did not have any shakiness of palpitations, her labs reflected hyperthyroidism She was told to cut her medication in half because her TSH was low and she appeared to have had silent thyroiditis Her TSH subsequently was high at 17  Recent history: She has been on 75 g levothyroxine since 06/2016  TSH subsequently has been consistently normal with 75 mcg levothyroxine daily  Previously with hypothyroidism she would have easy fatigability She make it fatigued if she is having long work hours but this is not consistent  No unusual cold intolerance Her weight is regularly around 112 pounds  She is very consistent with taking her levothyroxine daily in the morning at least about 30 minutes before breakfast.   Not taking any calcium or iron supplements or multivitamins  Her TSH level is almost the same again in the normal range   Her exam has not been showing any thyroid enlargement lately  Wt Readings from Last 3 Encounters:  08/29/22 113 lb 3.2 oz (51.3 kg)  08/29/21  112 lb 12.8 oz (51.2 kg)  08/22/20 112 lb 9.6 oz (51.1 kg)     Lab Results  Component Value Date   TSH 2.61 08/26/2022   TSH 2.45 08/21/2021   TSH 2.99 08/16/2020   FREET4 1.20 08/26/2022   FREET4 1.07 08/21/2021   FREET4 1.06 08/16/2020    Allergies as of 08/29/2022   No Known Allergies      Medication List        Accurate as of August 29, 2022  8:21 AM. If you have any questions, ask your nurse or doctor.          levothyroxine 75 MCG tablet Commonly known as: SYNTHROID TAKE 1 TABLET BY MOUTH EVERY DAY        Allergies: No Known Allergies  Past Medical History:  Diagnosis Date   Thyroid disease    Graves' disease in 2009    No past surgical history on file.  Family History  Problem Relation Age of Onset   Thyroid disease Mother    Diabetes Neg Hx    Cancer Neg Hx     Social History:  reports that she has never smoked. She has never used smokeless tobacco. No history on file for alcohol use and drug use.  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

## 2022-11-05 ENCOUNTER — Other Ambulatory Visit: Payer: Self-pay | Admitting: Endocrinology

## 2023-04-29 ENCOUNTER — Other Ambulatory Visit: Payer: Self-pay | Admitting: Endocrinology

## 2023-09-02 ENCOUNTER — Other Ambulatory Visit (INDEPENDENT_AMBULATORY_CARE_PROVIDER_SITE_OTHER): Payer: BC Managed Care – PPO

## 2023-09-02 ENCOUNTER — Other Ambulatory Visit: Payer: Self-pay

## 2023-09-02 ENCOUNTER — Other Ambulatory Visit: Payer: Self-pay | Admitting: Endocrinology

## 2023-09-02 DIAGNOSIS — E039 Hypothyroidism, unspecified: Secondary | ICD-10-CM | POA: Diagnosis not present

## 2023-09-02 LAB — TSH: TSH: 3.82 u[IU]/mL (ref 0.35–5.50)

## 2023-09-02 LAB — T4, FREE: Free T4: 1.27 ng/dL (ref 0.60–1.60)

## 2023-09-04 ENCOUNTER — Ambulatory Visit (INDEPENDENT_AMBULATORY_CARE_PROVIDER_SITE_OTHER): Payer: BC Managed Care – PPO | Admitting: Endocrinology

## 2023-09-04 ENCOUNTER — Encounter: Payer: Self-pay | Admitting: Endocrinology

## 2023-09-04 VITALS — BP 110/70 | HR 90 | Resp 16 | Ht 62.0 in | Wt 118.0 lb

## 2023-09-04 DIAGNOSIS — E039 Hypothyroidism, unspecified: Secondary | ICD-10-CM | POA: Diagnosis not present

## 2023-09-04 MED ORDER — LEVOTHYROXINE SODIUM 75 MCG PO TABS: ORAL_TABLET | ORAL | 4 refills | Status: DC

## 2023-09-04 NOTE — Progress Notes (Signed)
Outpatient Endocrinology Note [place] [name], MD  09/04/23  Patient's Name: Dawn Tran    DOB: November 08, 1981    MRN: [id number]  REASON OF VISIT: Follow-up for hypothyroidism  PCP: Maurice Small, MD (Inactive)  HISTORY OF PRESENT ILLNESS:   Dawn Tran is a 42 y.o. old female with past medical history as listed below is presented for a follow up of hypothyroidism.   Pertinent Thyroid History: Patient had hyperthyroidism from Graves' disease in 2009 which was treated with methimazole for 52-month and subsequently became euthyroid.  In 2013 she had mild hypothyroidism with symptoms of fatigue, felt better on low-dose of levothyroxine, no detail records available to review.  Patient has been on levothyroxine on a stable dose of 75 mcg daily since 2017.  Interval history 09/04/23 Patient has been taking levothyroxine 75 mcg daily, takes in the morning before breakfast and empty stomach and reports compliance.  Overall feeling good.  No symptoms of hypo or hyperthyroidism.  No palpitation.  Recent thyroid lab normal as follows.   Latest Reference Range & Units 09/02/23 08:17  TSH 0.35 - 5.50 uIU/mL 3.82  T4,Free(Direct) 0.60 - 1.60 ng/dL 6.21   REVIEW OF SYSTEMS:  As per history of present illness.   PAST MEDICAL HISTORY: Past Medical History:  Diagnosis Date   Thyroid disease    Graves' disease in 2009    PAST SURGICAL HISTORY: No past surgical history on file.  ALLERGIES: No Known Allergies  FAMILY HISTORY:  Family History  Problem Relation Age of Onset   Thyroid disease Mother    Diabetes Neg Hx    Cancer Neg Hx     SOCIAL HISTORY: Social History   Socioeconomic History   Marital status: Married    Spouse name: Not on file   Number of children: Not on file   Years of education: Not on file   Highest education level: Not on file  Occupational History   Not on file  Tobacco Use   Smoking status: Never   Smokeless tobacco: Never  Substance and Sexual  Activity   Alcohol use: Not on file   Drug use: Not on file   Sexual activity: Not on file  Other Topics Concern   Not on file  Social History Narrative   Not on file   Social Determinants of Health   Financial Resource Strain: Not on file  Food Insecurity: Not on file  Transportation Needs: Not on file  Physical Activity: Not on file  Stress: Not on file  Social Connections: Not on file    MEDICATIONS:  Current Outpatient Medications  Medication Sig Dispense Refill   levothyroxine (SYNTHROID) 75 MCG tablet TAKE 1 TABLET BY MOUTH EVERY DAY 90 tablet 4   No current facility-administered medications for this visit.    PHYSICAL EXAM: Vitals:   09/04/23 0820  BP: 110/70  Pulse: 90  Resp: 16  SpO2: 99%  Weight: 118 lb (53.5 kg)  Height: 5\' 2"  (1.575 m)   Body mass index is 21.58 kg/m.  Wt Readings from Last 3 Encounters:  09/04/23 118 lb (53.5 kg)  08/29/22 113 lb 3.2 oz (51.3 kg)  08/29/21 112 lb 12.8 oz (51.2 kg)     General: Well developed, well nourished female in no apparent distress.  HEENT: AT/[place], no external lesions. Hearing intact to the spoken word Eyes: EOMI. No stare, proptosis or lid lag. Conjunctiva clear and no icterus. Neck: Trachea midline, neck supple without appreciable thyromegaly or lymphadenopathy and no palpable  thyroid nodules Abdomen: Soft, non tender Neurologic: Alert, oriented, normal speech, deep tendon biceps reflexes normal,  no gross focal neurological deficit Extremities: No pedal pitting edema, no tremors of outstretched hands Skin: Warm, color good.  Psychiatric: Does not appear depressed or anxious  PERTINENT HISTORIC LABORATORY AND IMAGING STUDIES:  All pertinent laboratory results were reviewed. Please see HPI also for further details.   TSH  Date Value Ref Range Status  09/02/2023 3.82 0.35 - 5.50 uIU/mL Final  08/26/2022 2.61 0.35 - 5.50 uIU/mL Final  08/21/2021 2.45 0.35 - 5.50 uIU/mL Final     ASSESSMENT / PLAN  1.  Acquired hypothyroidism    -Patient has hypothyroidism at least from 2013.  In 2009 she was diagnosed with hypothyroidism secondary to Graves' disease, treated with methimazole and had spontaneous resolution and later developed hypothyroidism.  She did not have permanent treatment for Graves' disease.  -She has been currently taking levothyroxine 75 mcg daily.  Recent thyroid lab normal.  Plan: -Continue levothyroxine 75 mcg daily. -Annual thyroid lab and annual endocrinology follow-up.   Diagnoses and all orders for this visit:  Acquired hypothyroidism -     T4, free; Future -     TSH; Future  Other orders -     levothyroxine (SYNTHROID) 75 MCG tablet; TAKE 1 TABLET BY MOUTH EVERY DAY    DISPOSITION Follow up in clinic in 12 months suggested.  Thyroid lab few days prior to follow-up visit in 1 year.  All questions answered and patient verbalized understanding of the plan.  [place] Meloni Hinz, MD [facility] Endocrinology [facility] [address] [place], [address] [place], [place] [address] Phone # [phone]  At least part of this note was generated using voice recognition software. Inadvertent word errors may have occurred, which were not recognized during the proofreading process.

## 2023-10-29 ENCOUNTER — Other Ambulatory Visit: Payer: Self-pay

## 2023-10-29 DIAGNOSIS — E039 Hypothyroidism, unspecified: Secondary | ICD-10-CM

## 2023-10-29 MED ORDER — LEVOTHYROXINE SODIUM 75 MCG PO TABS
ORAL_TABLET | ORAL | 4 refills | Status: DC
Start: 2023-10-29 — End: 2024-09-01

## 2024-08-27 ENCOUNTER — Other Ambulatory Visit: Payer: BC Managed Care – PPO

## 2024-08-27 ENCOUNTER — Other Ambulatory Visit: Payer: Self-pay

## 2024-08-27 DIAGNOSIS — E063 Autoimmune thyroiditis: Secondary | ICD-10-CM

## 2024-08-27 DIAGNOSIS — E039 Hypothyroidism, unspecified: Secondary | ICD-10-CM

## 2024-08-27 LAB — TSH: TSH: 4.64 m[IU]/L — ABNORMAL HIGH

## 2024-08-27 LAB — T4, FREE: Free T4: 1.4 ng/dL (ref 0.8–1.8)

## 2024-08-30 ENCOUNTER — Ambulatory Visit: Payer: Self-pay | Admitting: Endocrinology

## 2024-09-01 ENCOUNTER — Encounter: Payer: Self-pay | Admitting: Endocrinology

## 2024-09-01 ENCOUNTER — Ambulatory Visit: Admitting: Endocrinology

## 2024-09-01 VITALS — BP 102/78 | HR 83 | Resp 20 | Ht 62.0 in | Wt 119.8 lb

## 2024-09-01 DIAGNOSIS — E039 Hypothyroidism, unspecified: Secondary | ICD-10-CM | POA: Diagnosis not present

## 2024-09-01 DIAGNOSIS — Z8639 Personal history of other endocrine, nutritional and metabolic disease: Secondary | ICD-10-CM

## 2024-09-01 MED ORDER — LEVOTHYROXINE SODIUM 88 MCG PO TABS: ORAL_TABLET | ORAL | 4 refills | Status: AC

## 2024-09-01 NOTE — Progress Notes (Signed)
 Outpatient Endocrinology Note [place] Nina Hoar, MD  09/01/24  Patient's Name: Dawn Tran    DOB: July 07, 1981    MRN: [id number]  REASON OF VISIT: Follow-up for hypothyroidism  PCP: Signa Dire, MD (Inactive)  HISTORY OF PRESENT ILLNESS:   Dawn Tran is a 43 y.o. old female with past medical history as listed below is presented for a follow up of hypothyroidism.   Pertinent Thyroid  History: Patient had hyperthyroidism from Graves' disease in 2009 which was treated with methimazole for 22-month and subsequently became euthyroid.  In 2013 she had mild hypothyroidism with symptoms of fatigue, felt better on low-dose of levothyroxine , no detail records available to review.  Patient has been on levothyroxine  on a stable dose of 75 mcg daily since 2017.  Interval history  Patient has been taking levothyroxine  75 mcg daily, reports taking 20 to 30 minutes before breakfast and taking every day without missing.  She does not take vitamins/multivitamin/calcium or iron supplement in the morning.  She has not felt new symptoms, denies cold intolerance, change in bowel habit or dry skin.  She has gained about 5 pounds of weight from last 2 years however relatively stable body weight for last 1 year.  No other complaints today.  She had recent lab with mildly elevated TSH as follows.   Latest Reference Range & Units 08/27/24 08:52  TSH mIU/L 4.64 (H)  T4,Free(Direct) 0.8 - 1.8 ng/dL 1.4  (H): Data is abnormally high  REVIEW OF SYSTEMS:  As per history of present illness.   PAST MEDICAL HISTORY: Past Medical History:  Diagnosis Date   Thyroid  disease    Graves' disease in 2009    PAST SURGICAL HISTORY: History reviewed. No pertinent surgical history.  ALLERGIES: No Known Allergies  FAMILY HISTORY:  Family History  Problem Relation Age of Onset   Thyroid  disease Mother    Diabetes Neg Hx    Cancer Neg Hx     SOCIAL HISTORY: Social History   Socioeconomic History   Marital  status: Married    Spouse name: Not on file   Number of children: Not on file   Years of education: Not on file   Highest education level: Not on file  Occupational History   Not on file  Tobacco Use   Smoking status: Never   Smokeless tobacco: Never  Substance and Sexual Activity   Alcohol use: Not on file   Drug use: Not on file   Sexual activity: Not on file  Other Topics Concern   Not on file  Social History Narrative   Not on file   Social Drivers of Health   Financial Resource Strain: Not on file  Food Insecurity: Not on file  Transportation Needs: Not on file  Physical Activity: Not on file  Stress: Not on file  Social Connections: Not on file    MEDICATIONS:  Current Outpatient Medications  Medication Sig Dispense Refill   levothyroxine  (SYNTHROID ) 88 MCG tablet TAKE 1 TABLET BY MOUTH EVERY DAY 90 tablet 4   No current facility-administered medications for this visit.    PHYSICAL EXAM: Vitals:   09/01/24 0854  BP: 102/78  Pulse: 83  Resp: 20  SpO2: 99%  Weight: 119 lb 12.8 oz (54.3 kg)  Height: 5' 2 (1.575 m)   Body mass index is 21.91 kg/m.  Wt Readings from Last 3 Encounters:  09/01/24 119 lb 12.8 oz (54.3 kg)  09/04/23 118 lb (53.5 kg)  08/29/22 113 lb 3.2 oz (51.3  kg)     General: Well developed, well nourished female in no apparent distress.  HEENT: AT/[place], no external lesions. Hearing intact to the spoken word Eyes: EOMI. No stare, proptosis or lid lag. Conjunctiva clear and no icterus. Neck: Trachea midline, neck supple without appreciable thyromegaly or lymphadenopathy and no palpable thyroid  nodules Abdomen: Soft, non tender Neurologic: Alert, oriented, normal speech, deep tendon biceps reflexes normal,  no gross focal neurological deficit Extremities: No pedal pitting edema, no tremors of outstretched hands Skin: Warm, color good.  Psychiatric: Does not appear depressed or anxious  PERTINENT HISTORIC LABORATORY AND IMAGING STUDIES:   All pertinent laboratory results were reviewed. Please see HPI also for further details.   TSH  Date Value Ref Range Status  08/27/2024 4.64 (H) mIU/L Final    Comment:              Reference Range .           > or = 20 Years  0.40-4.50 .                Pregnancy Ranges           First trimester    0.26-2.66           Second trimester   0.55-2.73           Third trimester    0.43-2.91   09/02/2023 3.82 0.35 - 5.50 uIU/mL Final  08/26/2022 2.61 0.35 - 5.50 uIU/mL Final     ASSESSMENT / PLAN  1. Acquired hypothyroidism   2. H/O Graves' disease     -Patient has hypothyroidism at least from 2013.  In 2009 she was diagnosed with hyperthyroidism secondary to Graves' disease, treated with methimazole and had spontaneous resolution and later developed hypothyroidism.  She did not have permanent treatment for Graves' disease.  -She has been currently taking levothyroxine  75 mcg daily.  Recent thyroid  lab with mildly elevated TSH.  She has no obvious hypothyroid symptoms.  She has been taking levothyroxine  properly and is compliant.   Plan: - Increase levothyroxine  from 75 to 88 mcg daily. -Check thyroid  function test in 2 months. -Annual thyroid  lab and annual endocrinology follow-up.   Diagnoses and all orders for this visit:  Acquired hypothyroidism -     levothyroxine  (SYNTHROID ) 88 MCG tablet; TAKE 1 TABLET BY MOUTH EVERY DAY -     T4, free -     TSH  H/O Graves' disease     DISPOSITION Follow up in clinic in 12 months suggested.  Thyroid  lab few days prior to follow-up visit in 1 year, and lab in 2 months.  All questions answered and patient verbalized understanding of the plan.  Iraq Kregg Cihlar, MD [facility] Endocrinology [facility] [address] [employer], [address] [place], [place] [address] Phone # [phone]  At least part of this note was generated using voice recognition software. Inadvertent word errors may have occurred, which were not recognized  during the proofreading process.

## 2024-09-03 ENCOUNTER — Ambulatory Visit: Payer: BC Managed Care – PPO | Admitting: Endocrinology

## 2024-11-01 ENCOUNTER — Other Ambulatory Visit

## 2024-11-02 ENCOUNTER — Ambulatory Visit: Payer: Self-pay | Admitting: Endocrinology

## 2024-11-02 LAB — T4, FREE: Free T4: 1.4 ng/dL (ref 0.8–1.8)

## 2024-11-02 LAB — TSH: TSH: 2.75 m[IU]/L

## 2025-08-29 ENCOUNTER — Other Ambulatory Visit

## 2025-09-01 ENCOUNTER — Ambulatory Visit: Admitting: Endocrinology
